# Patient Record
Sex: Female | Born: 1955 | Race: White | Hispanic: No | Marital: Married | Smoking: Never smoker
Health system: Southern US, Community
[De-identification: ages and names within clinical notes are randomized; demographics above are authoritative.]

## PROBLEM LIST (undated history)

## (undated) HISTORY — PX: CARPAL TUNNEL RELEASE: SHX101

---

## 2004-09-16 ENCOUNTER — Ambulatory Visit: Payer: Self-pay | Admitting: Professional

## 2004-10-03 ENCOUNTER — Ambulatory Visit: Payer: Self-pay | Admitting: Professional

## 2004-10-17 ENCOUNTER — Ambulatory Visit: Payer: Self-pay | Admitting: Professional

## 2004-12-16 ENCOUNTER — Ambulatory Visit: Payer: Self-pay | Admitting: Professional

## 2005-04-22 ENCOUNTER — Ambulatory Visit: Payer: Self-pay | Admitting: Unknown Physician Specialty

## 2005-04-25 ENCOUNTER — Ambulatory Visit: Payer: Self-pay | Admitting: Unknown Physician Specialty

## 2005-10-06 ENCOUNTER — Ambulatory Visit: Payer: Self-pay | Admitting: General Surgery

## 2006-02-09 ENCOUNTER — Ambulatory Visit: Payer: Self-pay | Admitting: Professional

## 2006-04-20 ENCOUNTER — Ambulatory Visit: Payer: Self-pay | Admitting: Unknown Physician Specialty

## 2006-05-06 ENCOUNTER — Ambulatory Visit: Payer: Self-pay | Admitting: Unknown Physician Specialty

## 2006-11-09 ENCOUNTER — Ambulatory Visit: Payer: Self-pay | Admitting: Unknown Physician Specialty

## 2006-11-11 ENCOUNTER — Ambulatory Visit: Payer: Self-pay | Admitting: Unknown Physician Specialty

## 2006-12-01 ENCOUNTER — Ambulatory Visit: Payer: Self-pay | Admitting: General Surgery

## 2007-01-14 ENCOUNTER — Ambulatory Visit: Payer: Self-pay | Admitting: General Surgery

## 2007-01-19 ENCOUNTER — Ambulatory Visit: Payer: Self-pay | Admitting: General Surgery

## 2007-04-04 IMAGING — US ULTRASOUND RIGHT BREAST
1 series · 14 of 14 positions shown · non-contrast
Comparison: none

REASON FOR EXAM: Right breast density   NO CHARGE   US if needed
COMMENTS:

PROCEDURE:     US  - US BREAST RIGHT  - May 06, 2006  [DATE]
RESULT:     No cystic or solid lesions are noted.  Reference is made to
additional view mammography report for full discussion of mammographic
findings and further recommendations.

[Series 1: ultrasound right breast · 14 of 14 slices shown]
[im 1/14]
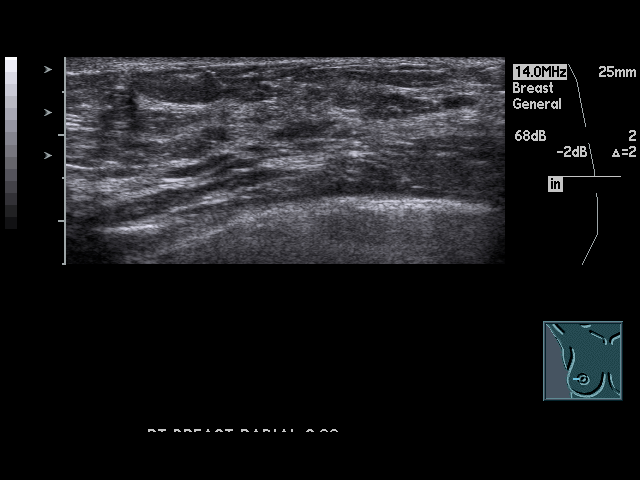
[im 2/14]
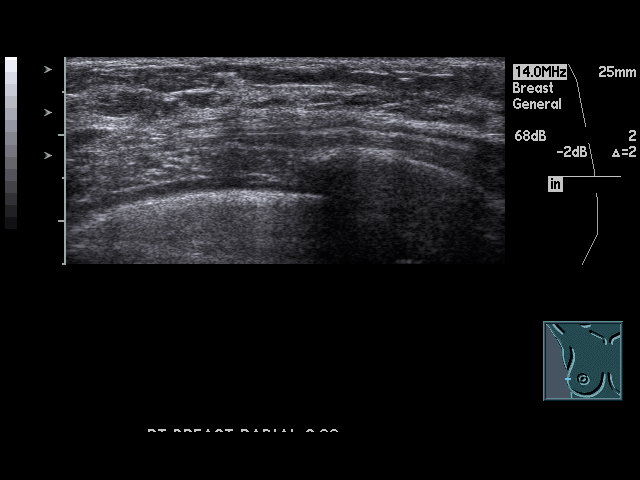
[im 3/14]
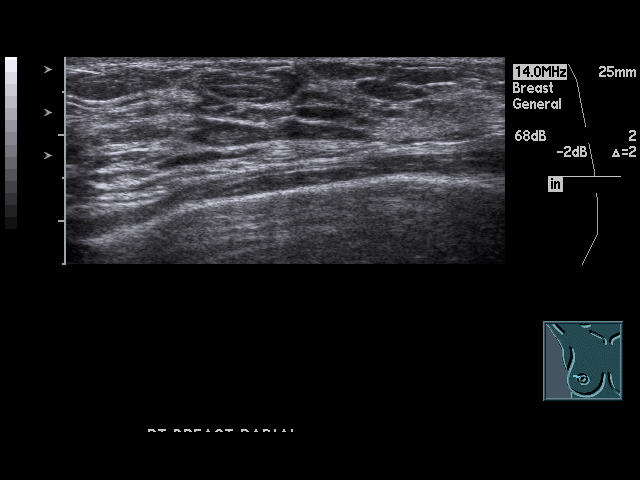
[im 4/14]
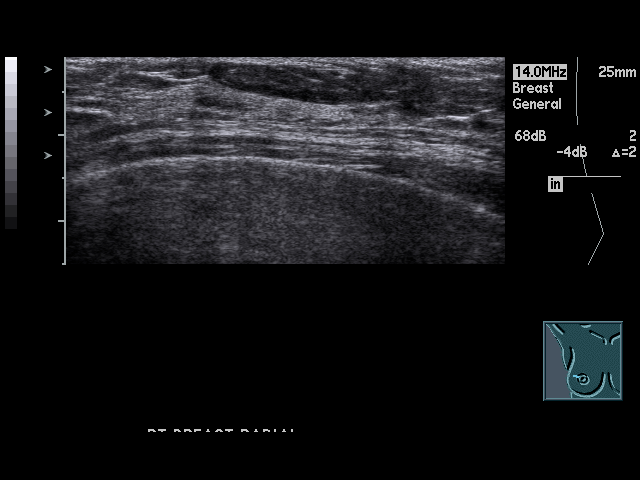
[im 5/14]
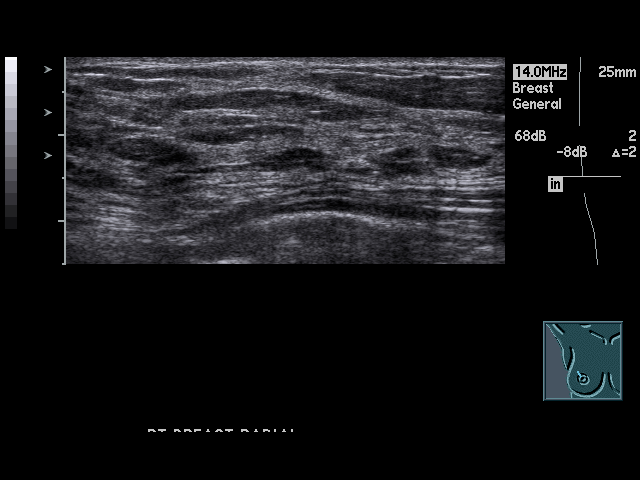
[im 6/14]
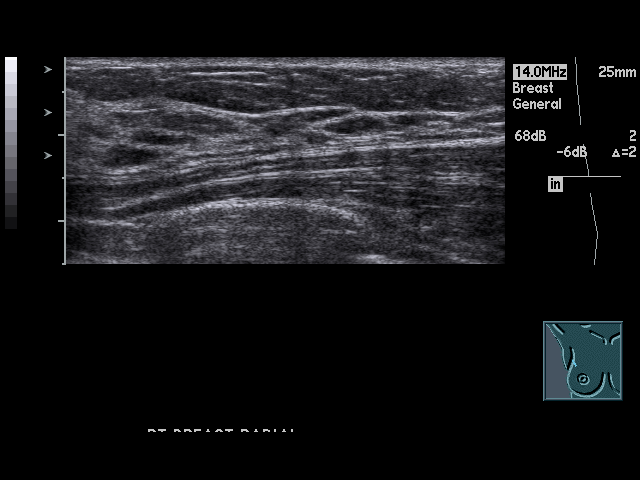
[im 7/14]
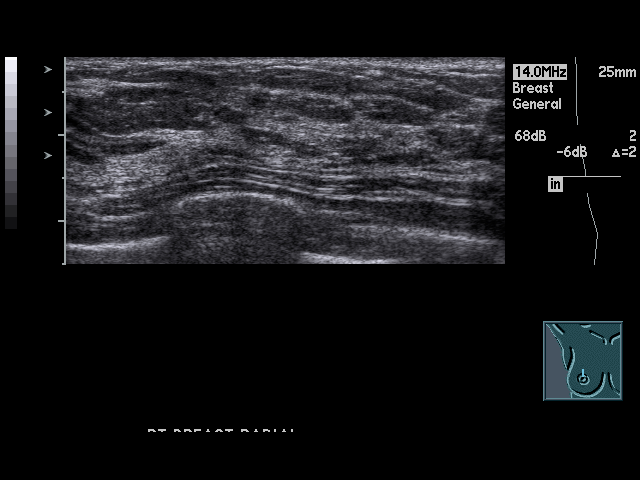
[im 8/14]
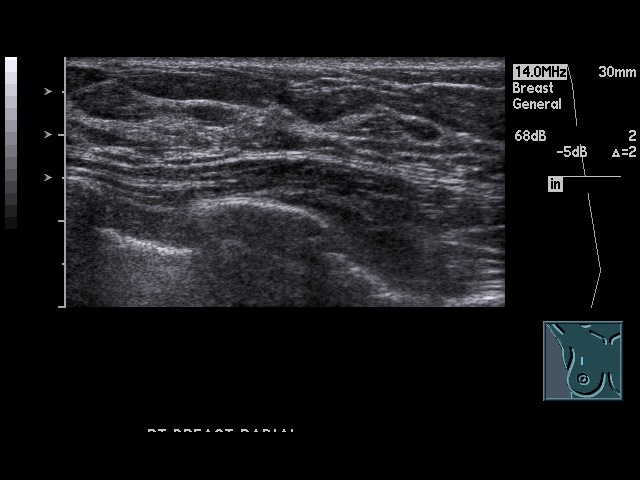
[im 9/14]
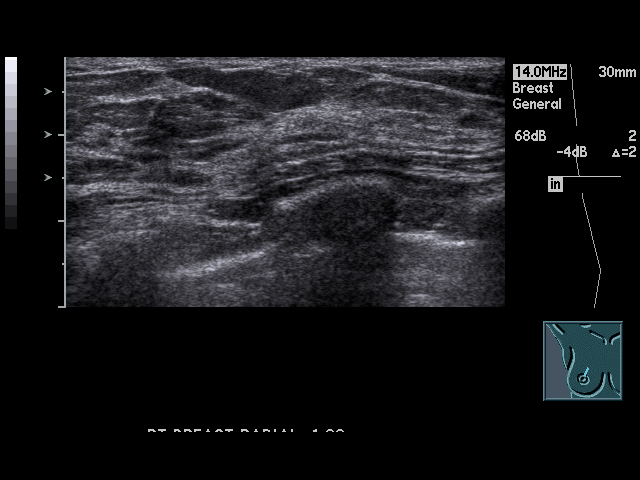
[im 10/14]
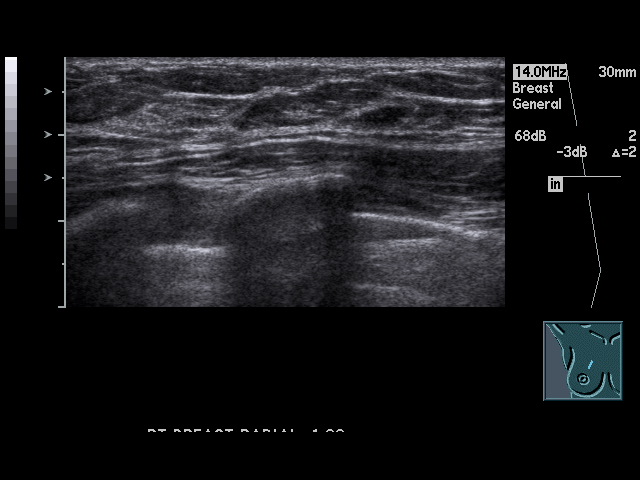
[im 11/14]
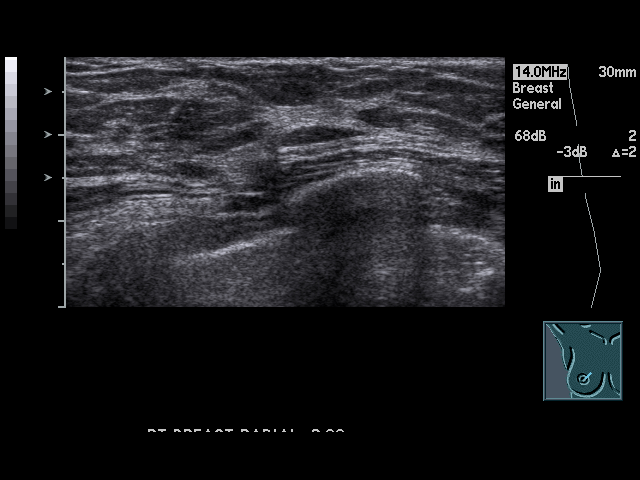
[im 12/14]
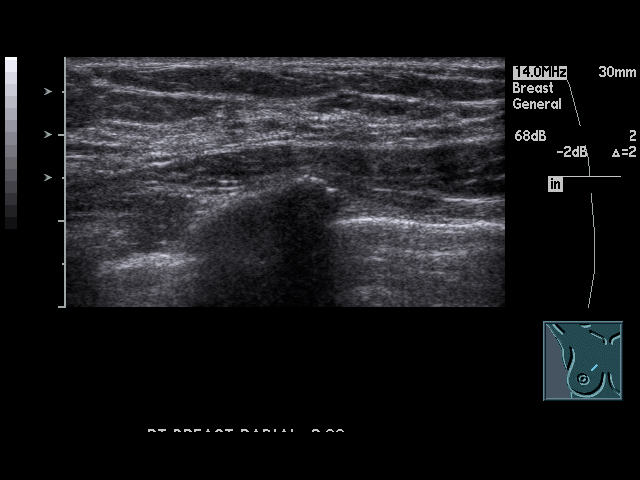
[im 13/14]
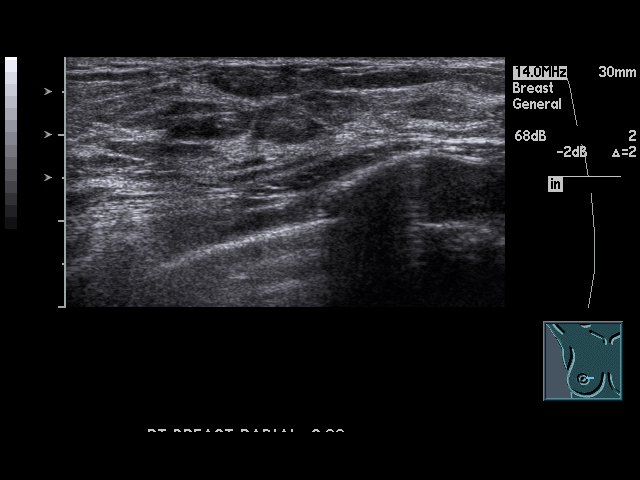
[im 14/14]
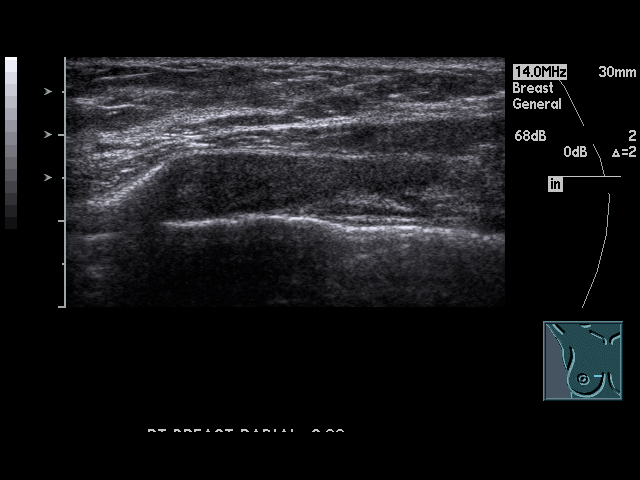

[14 of 14 positions shown; findings below may reference images not displayed]

IMPRESSION: Negative RIGHT breast ultrasound.

Negative mammogram and ultrasound does not preclude biopsy if a palpable
lesion is present.

## 2007-12-08 ENCOUNTER — Ambulatory Visit: Payer: Self-pay | Admitting: Unknown Physician Specialty

## 2008-07-13 ENCOUNTER — Ambulatory Visit: Payer: Self-pay | Admitting: Professional

## 2008-07-20 ENCOUNTER — Ambulatory Visit: Payer: Self-pay | Admitting: Professional

## 2008-07-27 ENCOUNTER — Ambulatory Visit: Payer: Self-pay | Admitting: Professional

## 2008-08-10 ENCOUNTER — Ambulatory Visit: Payer: Self-pay | Admitting: Professional

## 2008-08-28 ENCOUNTER — Ambulatory Visit: Payer: Self-pay | Admitting: Professional

## 2008-10-12 ENCOUNTER — Ambulatory Visit: Payer: Self-pay | Admitting: Professional

## 2009-03-06 ENCOUNTER — Ambulatory Visit: Payer: Self-pay | Admitting: Unknown Physician Specialty

## 2010-06-26 ENCOUNTER — Ambulatory Visit: Payer: Self-pay | Admitting: Unknown Physician Specialty

## 2011-11-04 ENCOUNTER — Ambulatory Visit: Payer: Self-pay | Admitting: Unknown Physician Specialty

## 2014-05-29 ENCOUNTER — Emergency Department (HOSPITAL_BASED_OUTPATIENT_CLINIC_OR_DEPARTMENT_OTHER)
Admission: EM | Admit: 2014-05-29 | Discharge: 2014-05-29 | Disposition: A | Discharge status: Home / Self Care | Payer: 59 | Attending: Emergency Medicine | Admitting: Emergency Medicine

## 2014-05-29 ENCOUNTER — Encounter (HOSPITAL_BASED_OUTPATIENT_CLINIC_OR_DEPARTMENT_OTHER): Payer: Self-pay | Admitting: Emergency Medicine

## 2014-05-29 DIAGNOSIS — M79609 Pain in unspecified limb: Secondary | ICD-10-CM | POA: Diagnosis present

## 2014-05-29 DIAGNOSIS — G5790 Unspecified mononeuropathy of unspecified lower limb: Secondary | ICD-10-CM | POA: Insufficient documentation

## 2014-05-29 DIAGNOSIS — G5731 Lesion of lateral popliteal nerve, right lower limb: Secondary | ICD-10-CM

## 2014-05-29 LAB — D-DIMER, QUANTITATIVE: D-Dimer, Quant: <0.27 ug/mL-FEU (ref 0.00–0.48)

## 2014-05-29 NOTE — Discharge Instructions (Signed)
Common Peroneal Nerve Entrapment with Rehab The peroneal nerve and its branches are responsible for muscle control of the muscles that extend to the toes, foot, ankle. This nerve is also responsible for sensation on the outer side of the lower leg and foot. Injury to the peroneal nerve results in problems with sensation and muscle control in these areas. Injury to the peroneal nerve often occurs in the area where the nerve passes around the top of one of the lower leg bones (fibular head). The nerve becomes trapped, causing pain, tingling, numbness, or burning sensations. SYMPTOMS   Pain, tingling, numbness, or burning on the top of the foot, ankle, or outer part of the lower leg.  Pain that gets worse with physical activity (walking, running, squatting).  Weakness when lifting the foot, including moving the ankle and toes upward (foot drop), or turning the foot outward with walking.  Problems walking (having to lift the foot high) or running, including tripping over the foot.  Inflammation, bruising (contusion), and tenderness near the outer part of the knee (or just below the knee). CAUSES  Common peroneal nerve entrapment is caused by pressure being placed on the peroneal nerve. This pressure may occur due to direct contact (being tackled at the knees), inflammation, a cyst in the knee, or a healing fracture around the knee. Less commonly, peroneal nerve entrapment may be caused by a stretch injury (knee sprain) or with swelling in the leg (compartment syndrome). RISK INCREASES WITH:  Recurring foot, ankle, or knee sprains.  Playing sports on uneven ground, which may result in knee or ankle sprains.  Direct injury (trauma) to the knee. PREVENTION  Warm up and stretch properly before activity.  Maintain physical fitness:  Strength, flexibility, and endurance.  Cardiovascular fitness.  Wear properly fitted and padded protective equipment. PROGNOSIS  Common peroneal nerve  entrapment is often curable with non-surgical treatment. Often symptoms will go away on their own (spontaneously). Sometimes, surgery is needed to relieve pressure from the nerve.  RELATED COMPLICATIONS   Permanent pain, tingling, numbness, or weakness of the affected foot, ankle, and leg.  Inability to compete, due to pain or weakness.  Injury to other parts of the body, as a result of repeated tripping and falling over the foot. TREATMENT Treatment first involves resting from any activities that cause the symptoms to get worse. The use of ice and medicine may reduce pain and inflammation. If ice is used, do not place it directly on the skin. Instead, place a towel in-between. If there is weakness of the muscles, causing foot drop, bracing the ankle and foot may be needed. It is important to perform strengthening and stretching exercises to maintain muscle strength. These exercises may be completed at home or with a therapist. If pain continues to get worse, despite treatment, or a cyst is present, surgery may be needed to relieve the pressure on the nerve. If the pressure on the nerve is due to compartment syndrome, a fascial (sheet of connective tissue) release may need to be performed. The earlier surgery is performed, the better your chances of full recovery.  MEDICATION   If pain medicine is needed, nonsteroidal anti-inflammatory medicines (aspirin and ibuprofen), or other minor pain relievers (acetaminophen), are often advised.  Do not take pain medicine for 7 days before surgery.  Prescription pain relievers may be given if your caregiver thinks they are needed. Use only as directed and only as much as you need. COLD THERAPY   Cold treatment (  icing) should be applied for 10 to 15 minutes every 2 to 3 hours for inflammation and pain, and immediately after activity that aggravates your symptoms. Use ice packs or an ice massage. SEEK MEDICAL CARE IF:   Symptoms get worse.  Symptoms do  not improve in 2 weeks, despite treatment.  New, unexplained symptoms develop. (Drugs used in treatment may produce side effects.) EXERCISES RANGE OF MOTION (ROM) AND STRETCHING EXERCISES - Common Peroneal Nerve Entrapment These exercises may help you when beginning to rehabilitate your injury. Your symptoms may go away with or without further involvement from your physician, physical therapist or athletic trainer. While completing these exercises, remember:   Restoring tissue flexibility helps normal motion to return to the joints. This allows healthier, less painful movement and activity.  An effective stretch should be held for at least 30 seconds.  A stretch should never be painful. You should only feel a gentle lengthening or release in the stretched tissue. RANGE OF MOTION - Ankle Eversion   Sit with your right / left ankle crossed over your opposite knee.  Grip your foot with your opposite hand, placing your thumb on the top of your foot and your fingers across the bottom of your foot.  Gently push your foot downward with a slight rotation, so your littlest toes rise slightly toward the ceiling.  You should feel a gentle stretch on the inside of your ankle. Hold the stretch for __________ seconds. Repeat __________ times. Complete this exercise __________ times per day.  RANGE OF MOTION - Ankle Inversion   Sit with your right / left ankle crossed over your opposite knee.  Grip your foot with your opposite hand, placing your thumb on the bottom of your foot and your fingers across the top of your foot.  Gently pull your foot so the smallest toe comes toward you and your thumb pushes the inside of the ball of your foot away from you.  You should feel a gentle stretch on the outside of your ankle. Hold the stretch for __________ seconds. Repeat __________ times. Complete this exercise __________ times per day.  RANGE OF MOTION - Ankle Dorsiflexion, Active Assisted   Remove your  shoes and sit on a chair, preferably not on a carpeted surface.  Place your right / left foot directly under your knee. Extend your opposite leg for support.  Keeping your heel down, slide your right / left foot back toward the chair, until you feel a stretch at your ankle or calf. If you do not feel a stretch, slide your bottom forward to the edge of the chair, while still keeping your heel down.  Hold this stretch for __________ seconds. Repeat __________ times. Complete this stretch __________ times per day.  STRETCH - Gastroc, Standing   Place your hands on a wall.  Extend your right / left leg behind you, and place a folded washcloth under the arch of your foot for support. Keep the front knee somewhat bent.  Slightly point your toes inward on your back foot.  Keeping your right / left heel on the floor and your knee straight, shift your weight toward the wall, not allowing your back to arch.  You should feel a gentle stretch in the right / left calf. Hold this position for __________ seconds. Repeat __________ times. Complete this stretch __________ times per day. STRETCH - Soleus, Standing   Place your hands on a wall.  Extend your right / left leg behind you, and place  a folded washcloth under the arch of your foot for support. Keep the front knee somewhat bent.  Slightly point your toes inward on your back foot.  Keep your right / left heel on the floor, bend your back knee, and slightly shift your weight over the back leg, so that you feel a gentle stretch deep in your back calf.  Hold this position for __________ seconds. Repeat __________ times. Complete this stretch __________ times per day. STRETCH - Hamstrings, Standing  Stand or sit and extend your right / left leg, placing your foot on a chair or foot stool, keeping a slight arch in your low back and your hips straight forward.  Lead with your chest and lean forward at the waist, until you feel a gentle stretch in  the back of your right / left knee or thigh. (When done correctly, this exercise requires leaning only a small distance.)  Hold this position for __________ seconds. Repeat __________ times. Complete this stretch __________ times per day. STRENGTHENING EXERCISES - Common Peroneal Nerve Entrapment These exercises may help you when beginning to rehabilitate your injury. They may resolve your symptoms with or without further involvement from your physician, physical therapist or athletic trainer. While completing these exercises, remember:   Muscles can gain both the endurance and the strength needed for everyday activities through controlled exercises.  Complete these exercises as instructed by your physician, physical therapist or athletic trainer. Increase the resistance and repetitions only as guided.  You may experience muscle soreness or fatigue, but the pain or discomfort you are trying to eliminate should never worsen during these exercises. If this pain does get worse, stop and make sure you are following the directions exactly. If the pain is still present after adjustments, discontinue the exercise until you can discuss the trouble with your caregiver. STRENGTH - Dorsiflexors  Secure a rubber exercise band or tubing to a fixed object (table, pole) and loop the other end around your right / left foot.  Sit on the floor facing the fixed object. The band should be slightly tense when your foot is relaxed.  Slowly draw your foot back toward you, using your ankle and toes.  Hold this position for __________ seconds. Slowly release the tension in the band and return your foot to the starting position. Repeat __________ times. Complete this exercise __________ times per day.  STRENGTH - Ankle Eversion   Secure one end of a rubber exercise band or tubing to a fixed object (table, pole). Loop the other end around your foot, just before your toes.  Place your fists between your knees. This  will focus your strengthening at your ankle.  Drawing the band across your opposite foot, away from the pole, slowly, pull your little toe out and up. Make sure the band is positioned to resist the entire motion.  Hold this position for __________ seconds.  Return to the starting position slowly, controlling the tension in the band. Repeat __________ times. Complete this exercise __________ times per day.  STRENGTH - Ankle Inversion   Secure one end of a rubber exercise band or tubing to a fixed object (table, pole). Loop the other end around your foot, just before your toes.  Place your fists between your knees. This will focus your strengthening at your ankle.  Slowly, pull your big toe up and in, making sure the band is positioned to resist the entire motion.  Hold this position for __________ seconds.  Return to the starting position  slowly, controlling the tension in the band. Repeat __________ times. Complete this exercises __________ times per day.  Document Released: 09/15/2005 Document Revised: 01/10/2013 Document Reviewed: 12/28/2008 [facility] Patient Information 2015 [place], [place]. This information is not intended to replace advice given to you by your health care provider. Make sure you discuss any questions you have with your health care provider.  Electroneurography Test This test measures the conduction of the nerves throughout the body. It is very similar to the way electricity travels throughout your house. When a stimulus is activated at one site, this test determines how long it takes to travel to another location such as making a muscle contract. It would be similar to turning on a light switch and measuring the time it takes for the light bulb to come on. This test is called electroneurography and studies the nerves. PREPARATION FOR TEST No preparation or fasting is necessary. NORMAL FINDINGS No evidence of peripheral nerve injury or disease (conduction velocity is  usually lower in the elderly). Ranges for normal findings may vary among different laboratories and hospitals. You should always check with your doctor after having lab work or other tests done to discuss the meaning of your test results and whether your values are considered within normal limits. MEANING OF TEST  Your caregiver will go over the test results with you and discuss the importance and meaning of your results, as well as treatment options and the need for additional tests if necessary. OBTAINING THE TEST RESULTS It is your responsibility to obtain your test results. Ask the lab or department performing the test when and how you will get your results. Document Released: 01/16/2005 Document Revised: 01/30/2014 Document Reviewed: 08/25/2008 [facility] Patient Information 2015 [place], [place]. This information is not intended to replace advice given to you by your health care provider. Make sure you discuss any questions you have with your health care provider.

## 2014-05-29 NOTE — ED Notes (Signed)
Pain in her right lower leg, hip and foot x 3 weeks. No sob.

## 2014-05-29 NOTE — ED Provider Notes (Signed)
CSN: [id number]     Arrival date & time 05/29/14  1508 History  This chart was scribed for Nelia Shi, MD, by Yevette Edwards, ED Scribe. This patient was seen in room MH03/MH03 and the patient's care was started at 3:55 PM.   First MD Initiated Contact with Patient 05/29/14 1555     Chief Complaint  Patient presents with  . Leg Pain    The history is provided by the patient. No language interpreter was used.   HPI Comments: Margaret Everett is a 58 y.o. female, with a h/o hormone replacement therapy, who presents to the Emergency Department complaining of right lateral lower leg pain which she characterizes as burning and which has persisted for three weeks. She endorses intermittent distributions of the pain to her right foot as well as her right buttock. She denies leg swelling, chest pain, and SOB. The pain is improved when she is recumbent. PTA, Margaret Everett called Dr. Charlott Rakes office which referred her to the ED for an ultrasound; she has not been assessed by a physician for the current symptoms. Margaret Everett reports she sits at work for the predominant part of the day and she often sits with her legs crossed, with the right leg crossed over the left. She states she ceased using hormone replacement therapy four days ago. Margaret Everett is a non-smoker.   History reviewed. No pertinent past medical history. Past Surgical History  Procedure Laterality Date  . Carpal tunnel release     No family history on file. History  Substance Use Topics  . Smoking status: Never Smoker   . Smokeless tobacco: Not on file  . Alcohol Use: No   No OB history provided.  Review of Systems  Respiratory: Negative for shortness of breath.   Cardiovascular: Negative for chest pain and leg swelling.  Musculoskeletal: Positive for myalgias.  All other systems reviewed and are negative.   Allergies  Review of patient's allergies indicates no known allergies.  Home Medications   Prior to Admission medications    Medication Sig Start Date End Date Taking? Authorizing Provider  estradiol (VIVELLE-DOT) 0.0375 MG/24HR Place 1 patch onto the skin 2 (two) times a week.   Yes Historical Provider, MD  PROGESTERONE MICRONIZED TD Place onto the skin.   Yes Historical Provider, MD   Triage Vitals: BP 157/77  Pulse 94  Temp(Src) 98.5 F (36.9 C) (Oral)  Resp 18  Ht 5' 2.5" (1.588 m)  Wt 135 lb (61.236 kg)  BMI 24.28 kg/m2  SpO2 100%  Physical Exam Physical Exam  Nursing note and vitals reviewed. Constitutional: She is oriented to person, place, and time. She appears well-developed and well-nourished. No distress.  HENT:  Head: Normocephalic and atraumatic.  Eyes: Pupils are equal, round, and reactive to light.  Neck: Normal range of motion.  Cardiovascular: Normal rate and intact distal pulses.   Pulmonary/Chest: No respiratory distress.  Abdominal: Normal appearance. She exhibits no distension.  Musculoskeletal: Normal range of motion.  Homans sign is negative.  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  No swelling or calf tenderness noted.  No thigh tenderness or swelling noted. Neurological: She is alert and oriented to person, place, and time. No cranial nerve deficit.  Skin: Skin is warm and dry. No rash noted.  Psychiatric: She has a normal mood and affect. Her behavior is normal.   ED Course  Procedures (including critical care time)  DIAGNOSTIC STUDIES: Oxygen Saturation is 100% on room air,  normal by my interpretation.    COORDINATION OF CARE:  4:03 PM- Discussed treatment plan with patient, and the patient agreed to the plan. The plan includes a D-dimer.   Labs Review Labs Reviewed  D-DIMER, QUANTITATIVE    Imaging Review No results found.  I suspect from the physical exam and negative d-dimer that is a peroneal nerve neuropathy and not a deep venous thrombosis.  Patient is stable at time of discharge and can followup with her family  decision for nerve conduction velocity or further workup as needed.  MDM   Final diagnoses:  Peroneal neuropathy at knee, right     I personally performed the services described in this documentation, which was scribed in my presence. The recorded information has been reviewed and considered.    Nelia Shi, MD 05/29/14 [phone]

## 2014-05-29 NOTE — ED Notes (Signed)
Right calf pain, sent by Dr. Tenny Craw for Venous doppler study.  Pt states pain started three weeks ago.  No chest pain or SOB.

## 2020-02-25 ENCOUNTER — Ambulatory Visit: Payer: Self-pay | Attending: Internal Medicine

## 2020-02-25 DIAGNOSIS — Z23 Encounter for immunization: Secondary | ICD-10-CM

## 2020-02-25 NOTE — Progress Notes (Signed)
   Covid-19 Vaccination Clinic  Name:  Margaret Everett    MRN: [id number] DOB: 07/14/1956  02/25/2020  [name] was observed post Covid-19 immunization for 15 minutes without incident. She was provided with Vaccine Information Sheet and instruction to access the V-Safe system.   [name] was instructed to call 911 with any severe reactions post vaccine: Marland Kitchen Difficulty breathing  . Swelling of face and throat  . A fast heartbeat  . A bad rash all over body  . Dizziness and weakness   Immunizations Administered    Name Date Dose VIS Date Route   Pfizer COVID-19 Vaccine 02/25/2020  8:50 AM 0.3 mL 11/23/2018 Intramuscular   Manufacturer: [employer], [employer]   Lot: [id number]   NDC: [address]-3

## 2020-03-19 ENCOUNTER — Ambulatory Visit: Payer: Self-pay | Attending: Internal Medicine

## 2020-03-19 DIAGNOSIS — Z23 Encounter for immunization: Secondary | ICD-10-CM

## 2020-03-19 NOTE — Progress Notes (Signed)
   Covid-19 Vaccination Clinic  Name:  Margaret Everett    MRN: [id number] DOB: 09/14/1956  03/19/2020  [name] was observed post Covid-19 immunization for 15 minutes without incident. She was provided with Vaccine Information Sheet and instruction to access the V-Safe system.   [name] was instructed to call 911 with any severe reactions post vaccine: Marland Kitchen Difficulty breathing  . Swelling of face and throat  . A fast heartbeat  . A bad rash all over body  . Dizziness and weakness   Immunizations Administered    Name Date Dose VIS Date Route   Pfizer COVID-19 Vaccine 03/19/2020  3:53 PM 0.3 mL 11/23/2018 Intramuscular   Manufacturer: [employer], [employer]   Lot: [id number]   NDC: [address]-7

## 2020-05-30 ENCOUNTER — Other Ambulatory Visit: Payer: Self-pay | Admitting: Obstetrics and Gynecology

## 2020-05-30 DIAGNOSIS — R928 Other abnormal and inconclusive findings on diagnostic imaging of breast: Secondary | ICD-10-CM

## 2020-06-13 ENCOUNTER — Ambulatory Visit
Admission: RE | Admit: 2020-06-13 | Discharge: 2020-06-13 | Disposition: A | Discharge status: Home / Self Care | Payer: 59 | Source: Ambulatory Visit | Attending: Obstetrics and Gynecology | Admitting: Obstetrics and Gynecology

## 2020-06-13 ENCOUNTER — Other Ambulatory Visit: Payer: Self-pay | Admitting: Obstetrics and Gynecology

## 2020-06-13 ENCOUNTER — Other Ambulatory Visit: Payer: Self-pay

## 2020-06-13 DIAGNOSIS — R928 Other abnormal and inconclusive findings on diagnostic imaging of breast: Secondary | ICD-10-CM

## 2020-12-12 ENCOUNTER — Other Ambulatory Visit: Payer: Self-pay | Admitting: Obstetrics and Gynecology

## 2020-12-12 ENCOUNTER — Ambulatory Visit
Admission: RE | Admit: 2020-12-12 | Discharge: 2020-12-12 | Disposition: A | Discharge status: Home / Self Care | Payer: 59 | Source: Ambulatory Visit | Attending: Obstetrics and Gynecology | Admitting: Obstetrics and Gynecology

## 2020-12-12 ENCOUNTER — Other Ambulatory Visit: Payer: Self-pay

## 2020-12-12 DIAGNOSIS — R928 Other abnormal and inconclusive findings on diagnostic imaging of breast: Secondary | ICD-10-CM

## 2020-12-12 DIAGNOSIS — N63 Unspecified lump in unspecified breast: Secondary | ICD-10-CM

## 2021-06-17 ENCOUNTER — Ambulatory Visit
Admission: RE | Admit: 2021-06-17 | Discharge: 2021-06-17 | Disposition: A | Discharge status: Home / Self Care | Payer: BC Managed Care – PPO | Source: Ambulatory Visit | Attending: Obstetrics and Gynecology | Admitting: Obstetrics and Gynecology

## 2021-06-17 ENCOUNTER — Other Ambulatory Visit: Payer: Self-pay

## 2021-06-17 DIAGNOSIS — N63 Unspecified lump in unspecified breast: Secondary | ICD-10-CM

## 2022-05-16 IMAGING — US US BREAST*L* LIMITED INC AXILLA
1 series · 7 of 7 positions shown · non-contrast
Comparison: Previous exam(s).

CLINICAL DATA: 65-year-old female presenting for follow-up of a
likely benign left breast mass.

EXAM:
DIGITAL DIAGNOSTIC BILATERAL MAMMOGRAM WITH TOMOSYNTHESIS AND CAD;
ULTRASOUND LEFT BREAST LIMITED
TECHNIQUE: Bilateral digital diagnostic mammography and breast tomosynthesis
was performed. The images were evaluated with computer-aided
detection.; Targeted ultrasound examination of the left breast was
performed.

[Series 1: us breast*left* limited inc axilla · 0.06mm/px · 7 of 7 slices shown]
[im 1/7]
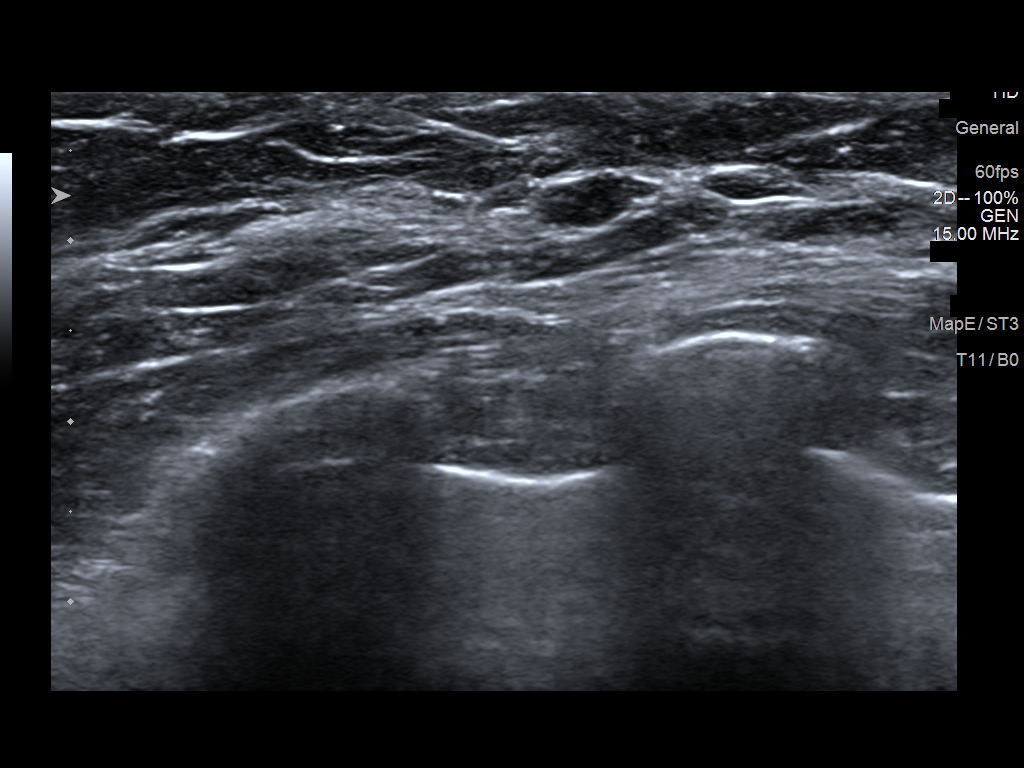
[im 2/7]
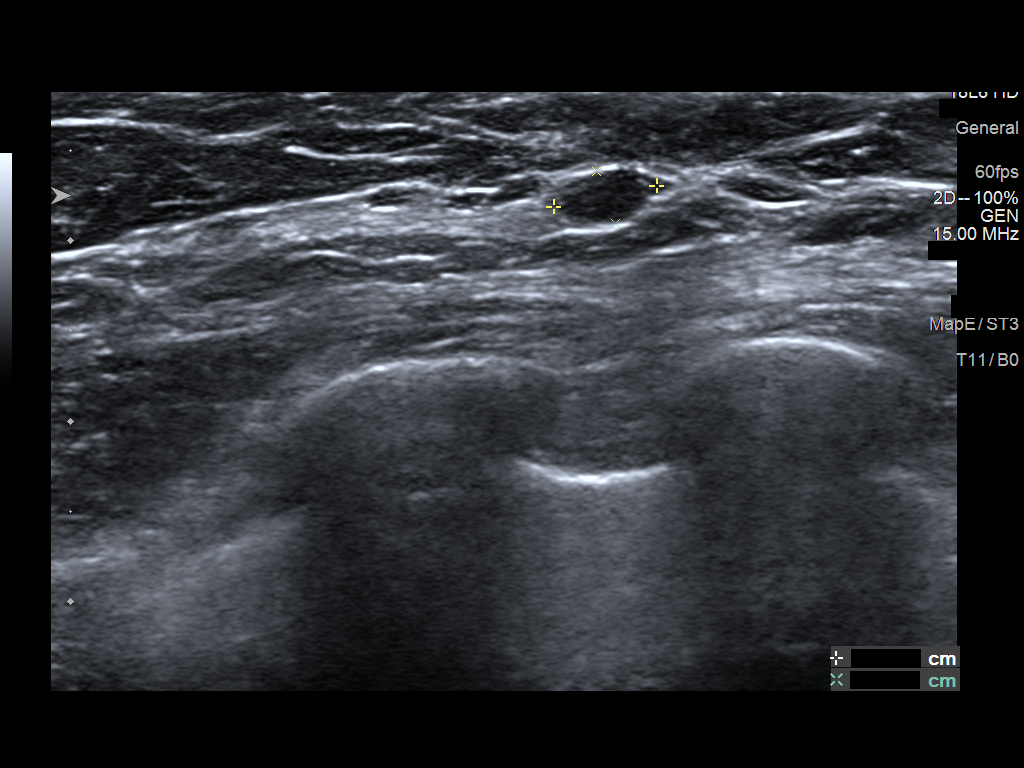
[im 3/7]
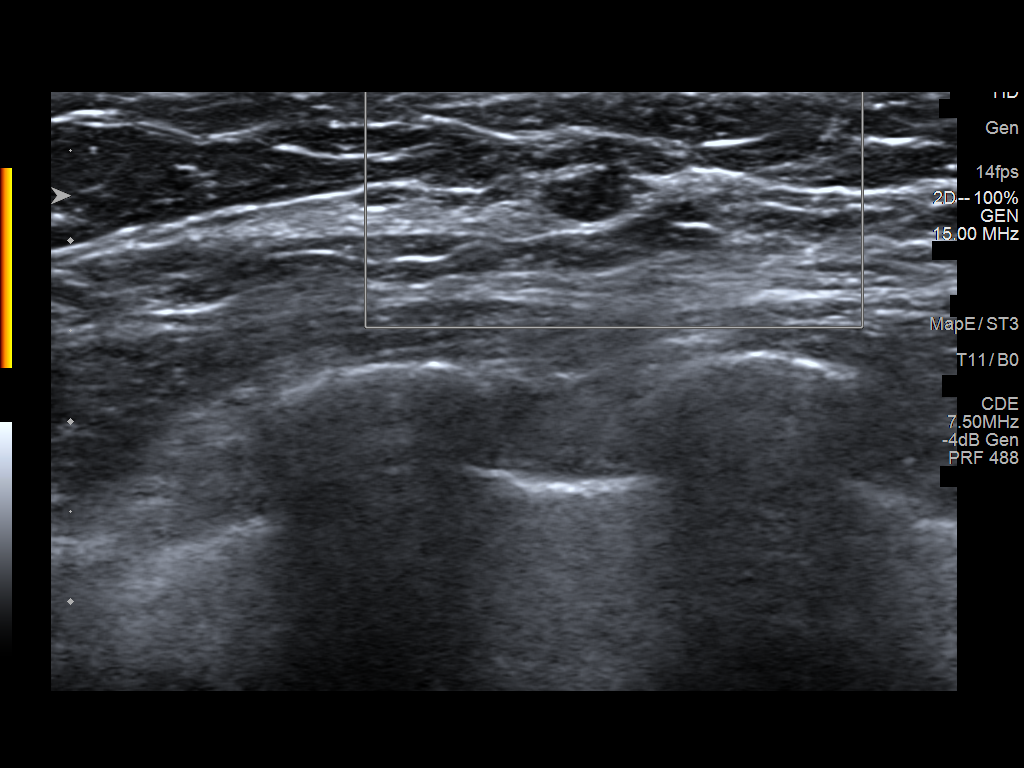
[im 4/7]
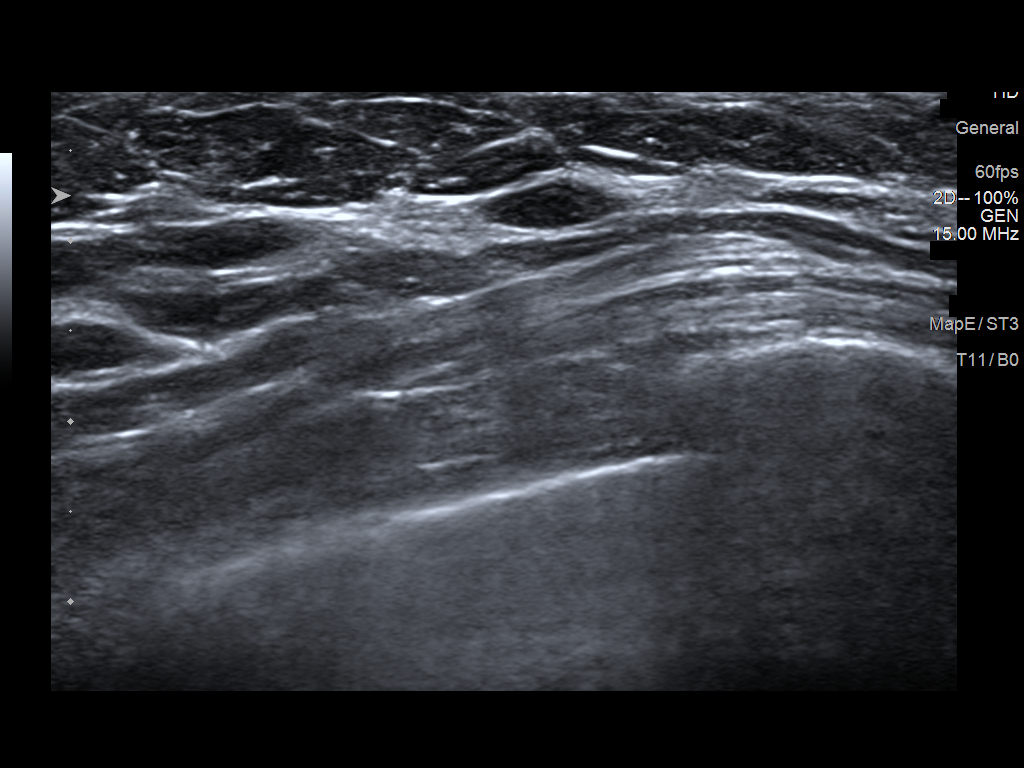
[im 5/7]
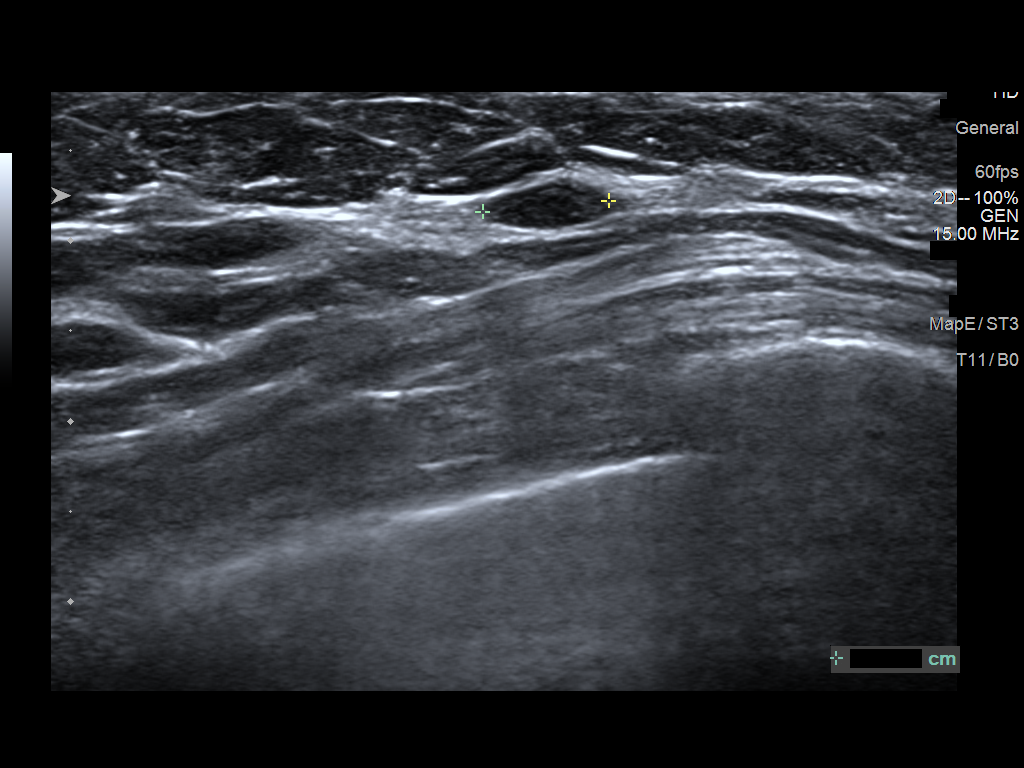
[im 6/7]
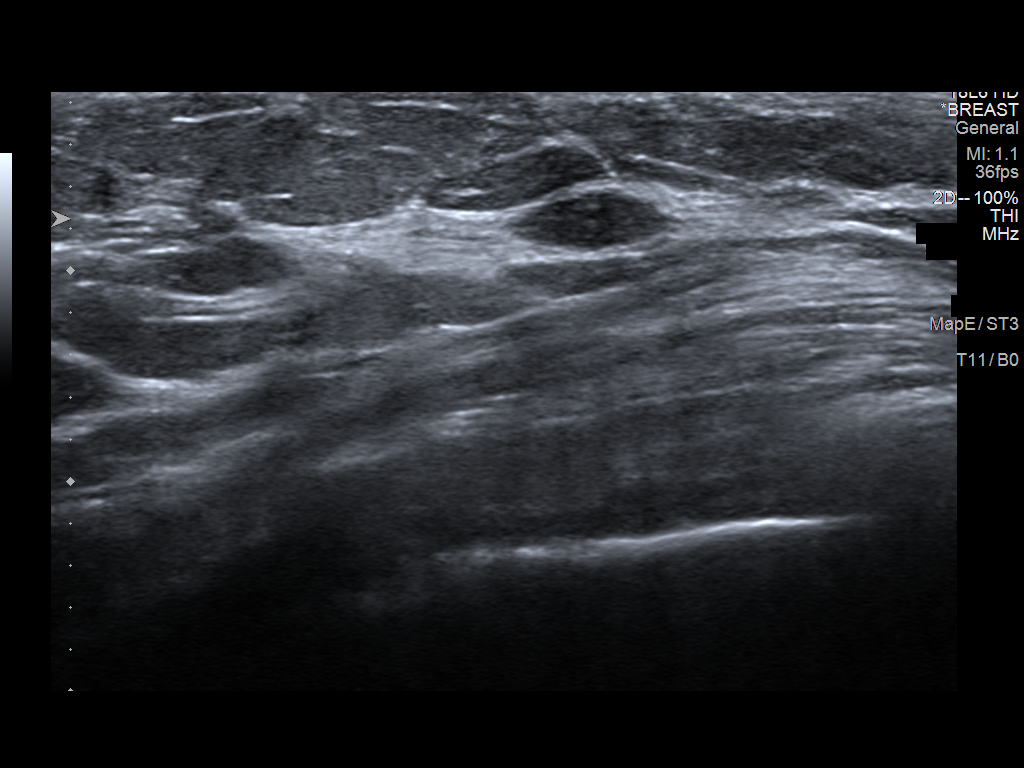
[im 7/7]
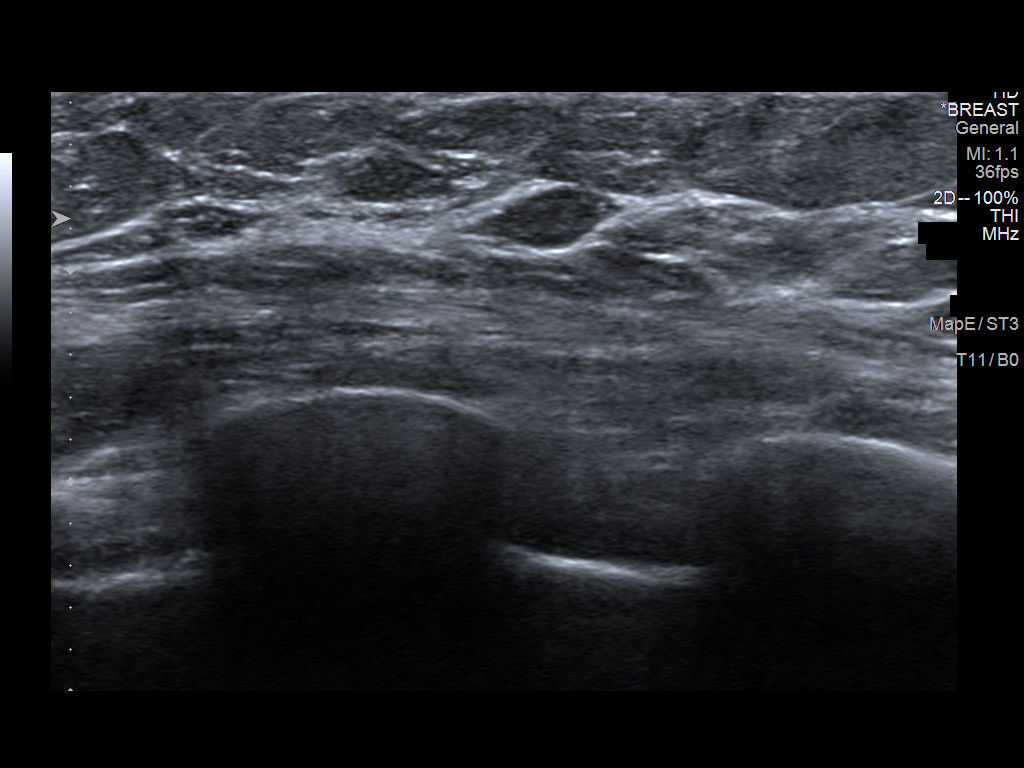

[7 of 7 positions shown; findings below may reference images not displayed]

ACR Breast Density Category b: There are scattered areas of
fibroglandular density.
FINDINGS: No suspicious calcifications, masses or areas of distortion are seen
in the bilateral breasts. There is a persistent mass seen in the
lateral left breast which appears mildly less prominent than on the
comparison exam. No suspicious calcifications, masses or areas of
distortion are seen in the bilateral breasts.

Ultrasound targeted to the left breast at 2 o'clock, 6 cm from the
nipple demonstrates a stable oval hypoechoic mass measuring 7 x 3 x
6 mm, previously measuring 7 x 3 x 7 mm.
IMPRESSION: 1.  The likely benign 7 mm left breast mass at 2 o'clock is stable.

2.  No mammographic evidence of malignancy in the bilateral breasts.

RECOMMENDATION:
Bilateral diagnostic mammogram and left breast ultrasound in 1 year.

I have discussed the findings and recommendations with the patient.
If applicable, a reminder letter will be sent to the patient
regarding the next appointment.

BI-RADS CATEGORY  3: Probably benign.

## 2022-05-16 IMAGING — MG DIGITAL DIAGNOSTIC BILAT W/ TOMO W/ CAD
8 series · 8 of 24 positions shown · non-contrast
Comparison: Previous exam(s).

CLINICAL DATA: 65-year-old female presenting for follow-up of a
likely benign left breast mass.

EXAM:
DIGITAL DIAGNOSTIC BILATERAL MAMMOGRAM WITH TOMOSYNTHESIS AND CAD;
ULTRASOUND LEFT BREAST LIMITED
TECHNIQUE: Bilateral digital diagnostic mammography and breast tomosynthesis
was performed. The images were evaluated with computer-aided
detection.; Targeted ultrasound examination of the left breast was
performed.

[R CC synth-2D]
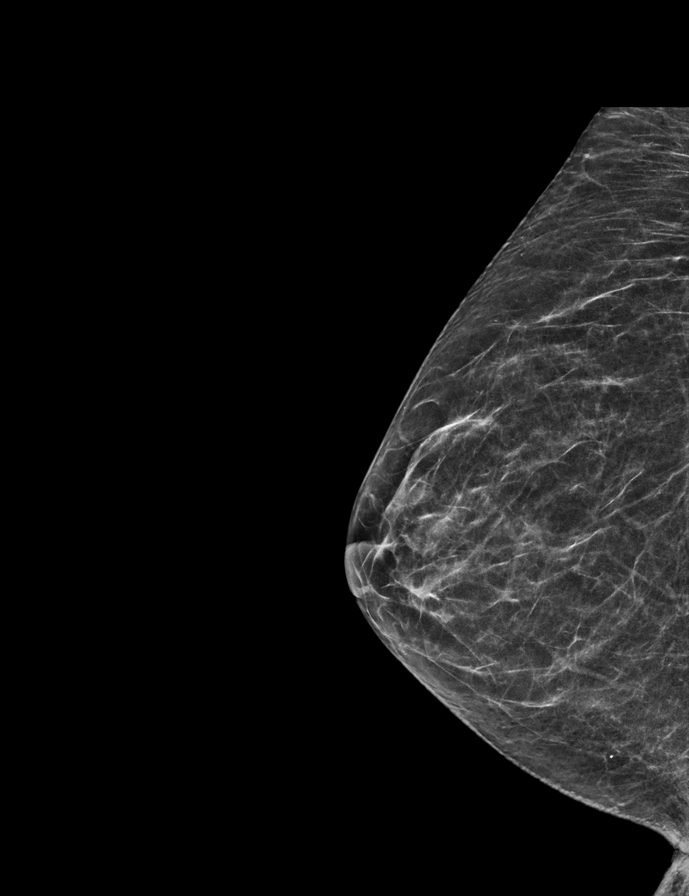

[R MLO synth-2D]
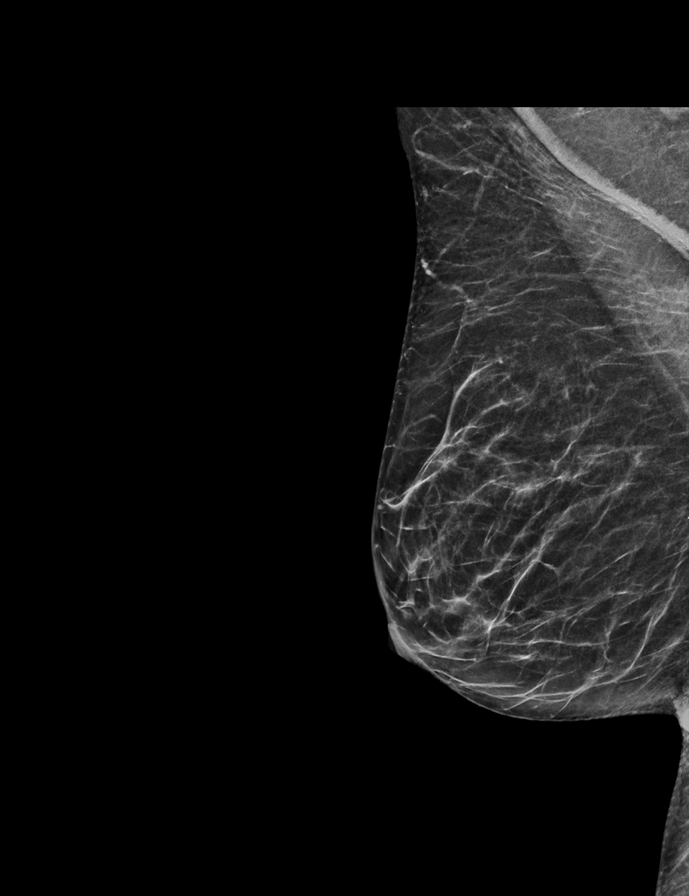

[L MLO synth-2D]
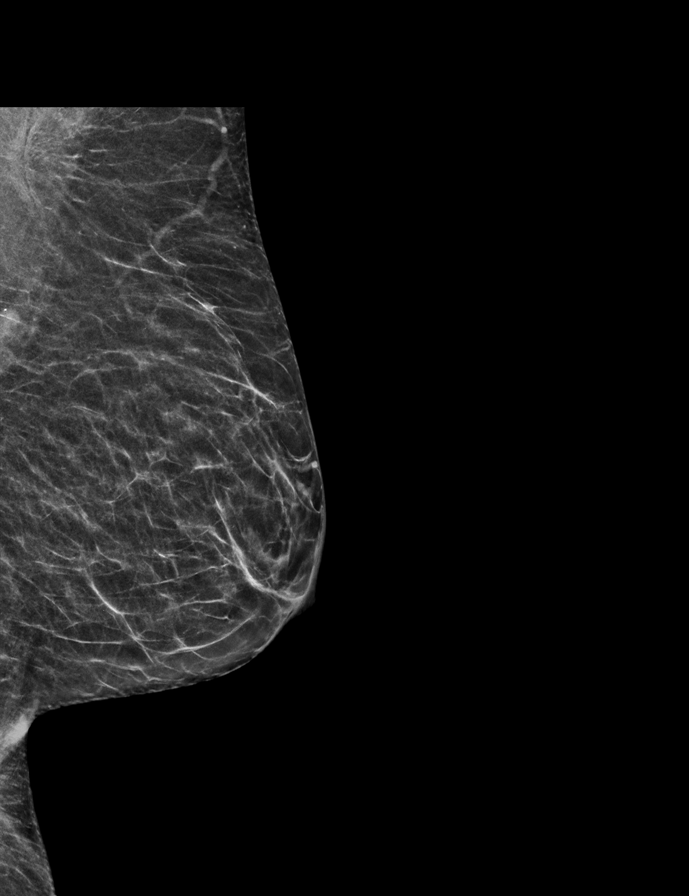

[L CC synth-2D]
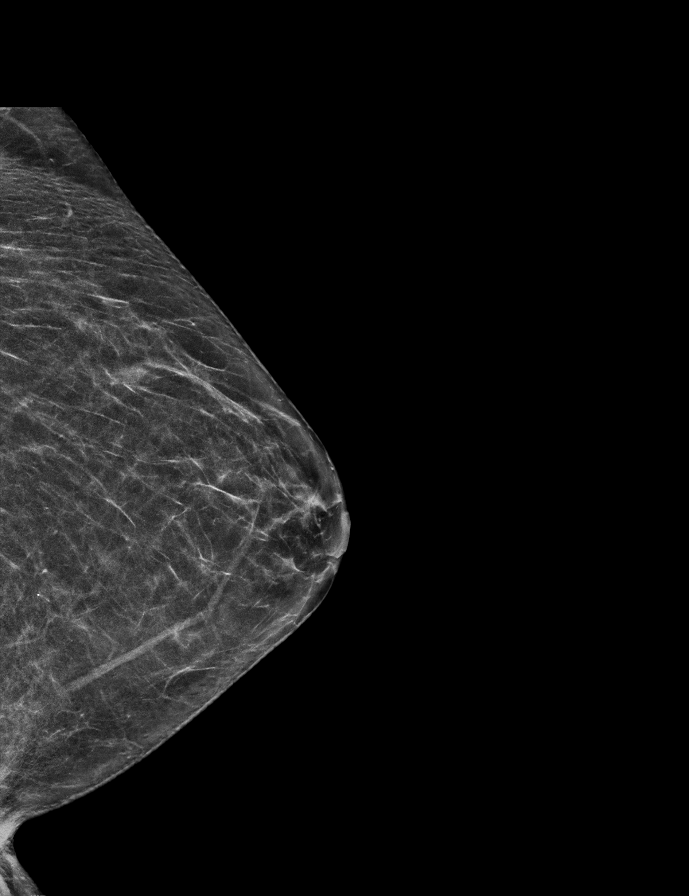

[R MLO tomo · tomo slice 27/52.0]
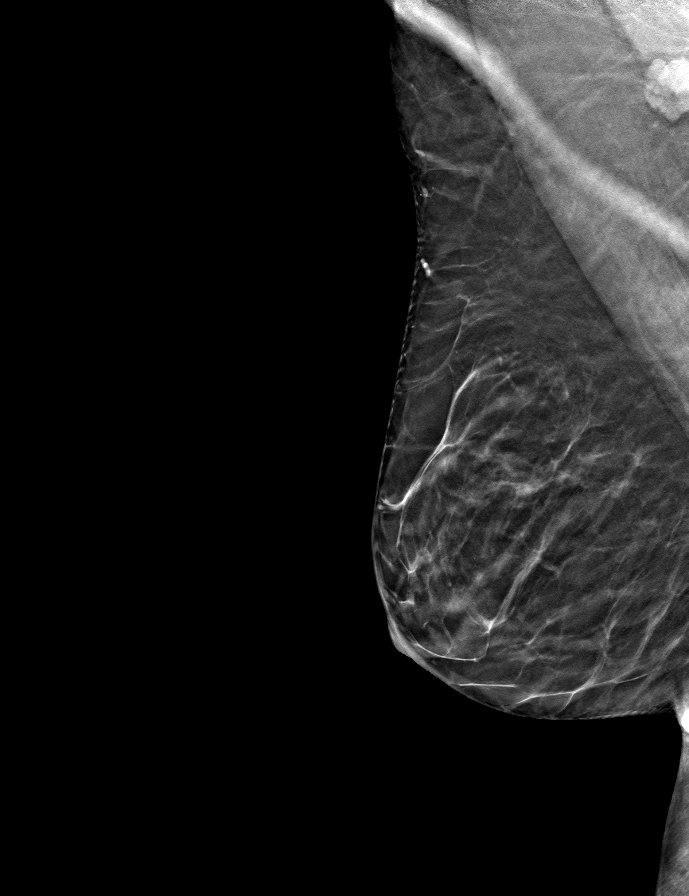

[L MLO tomo · tomo slice 25/49.0]
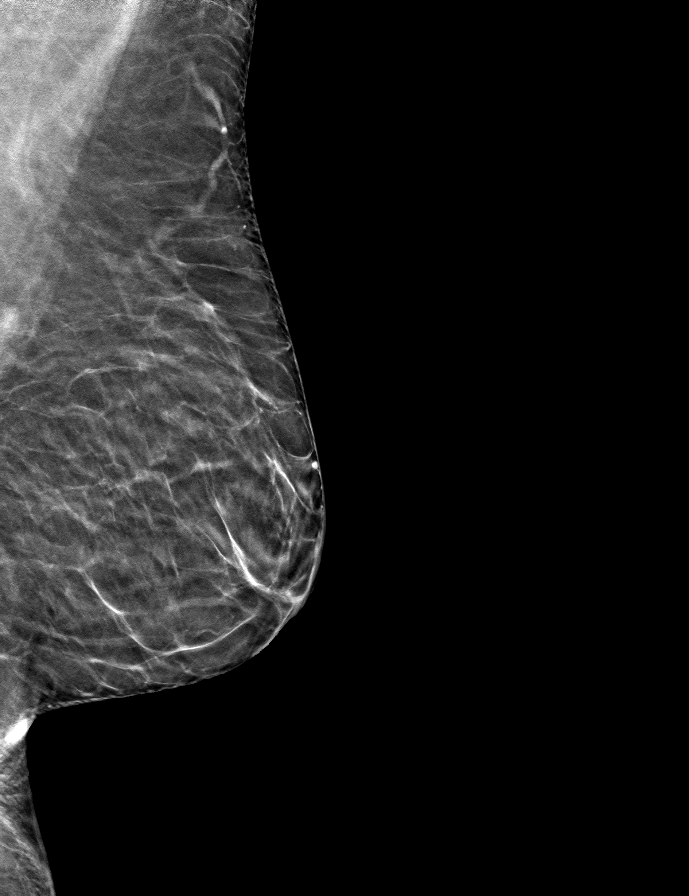

[R CC tomo · tomo slice 25/49.0]
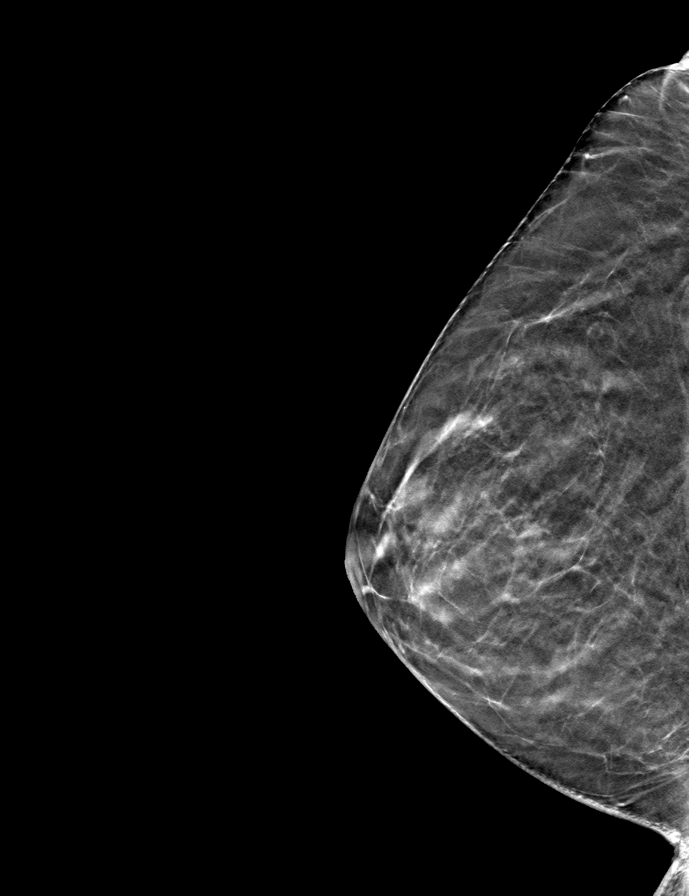

[L CC tomo · tomo slice 27/52.0]
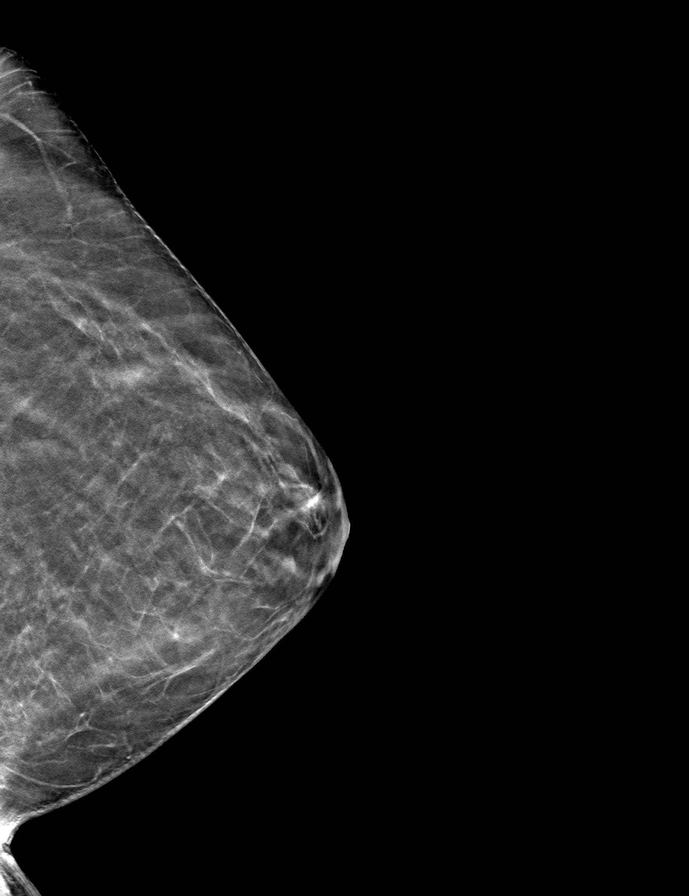

[8 of 24 positions shown; findings below may reference images not displayed]

ACR Breast Density Category b: There are scattered areas of
fibroglandular density.
FINDINGS: No suspicious calcifications, masses or areas of distortion are seen
in the bilateral breasts. There is a persistent mass seen in the
lateral left breast which appears mildly less prominent than on the
comparison exam. No suspicious calcifications, masses or areas of
distortion are seen in the bilateral breasts.

Ultrasound targeted to the left breast at 2 o'clock, 6 cm from the
nipple demonstrates a stable oval hypoechoic mass measuring 7 x 3 x
6 mm, previously measuring 7 x 3 x 7 mm.
IMPRESSION: 1.  The likely benign 7 mm left breast mass at 2 o'clock is stable.

2.  No mammographic evidence of malignancy in the bilateral breasts.

RECOMMENDATION:
Bilateral diagnostic mammogram and left breast ultrasound in 1 year.

I have discussed the findings and recommendations with the patient.
If applicable, a reminder letter will be sent to the patient
regarding the next appointment.

BI-RADS CATEGORY  3: Probably benign.

## 2022-05-23 ENCOUNTER — Other Ambulatory Visit: Payer: Self-pay | Admitting: Obstetrics and Gynecology

## 2022-05-23 DIAGNOSIS — Z09 Encounter for follow-up examination after completed treatment for conditions other than malignant neoplasm: Secondary | ICD-10-CM

## 2022-06-18 ENCOUNTER — Ambulatory Visit
Admission: RE | Admit: 2022-06-18 | Discharge: 2022-06-18 | Disposition: A | Discharge status: Home / Self Care | Payer: BC Managed Care – PPO | Source: Ambulatory Visit | Attending: Obstetrics and Gynecology | Admitting: Obstetrics and Gynecology

## 2022-06-18 DIAGNOSIS — Z09 Encounter for follow-up examination after completed treatment for conditions other than malignant neoplasm: Secondary | ICD-10-CM

## 2023-05-13 ENCOUNTER — Other Ambulatory Visit: Payer: Self-pay | Admitting: Obstetrics and Gynecology

## 2023-05-13 DIAGNOSIS — Z1231 Encounter for screening mammogram for malignant neoplasm of breast: Secondary | ICD-10-CM

## 2023-06-25 ENCOUNTER — Ambulatory Visit: Payer: BC Managed Care – PPO

## 2023-07-10 ENCOUNTER — Ambulatory Visit
Admission: RE | Admit: 2023-07-10 | Discharge: 2023-07-10 | Disposition: A | Discharge status: Home / Self Care | Payer: BC Managed Care – PPO | Source: Ambulatory Visit | Attending: Obstetrics and Gynecology | Admitting: Obstetrics and Gynecology

## 2023-07-10 DIAGNOSIS — Z1231 Encounter for screening mammogram for malignant neoplasm of breast: Secondary | ICD-10-CM

## 2024-06-24 ENCOUNTER — Other Ambulatory Visit: Payer: Self-pay | Admitting: Obstetrics and Gynecology

## 2024-06-24 DIAGNOSIS — Z1231 Encounter for screening mammogram for malignant neoplasm of breast: Secondary | ICD-10-CM

## 2024-07-13 ENCOUNTER — Ambulatory Visit
Admission: RE | Admit: 2024-07-13 | Discharge: 2024-07-13 | Disposition: A | Discharge status: Home / Self Care | Source: Ambulatory Visit | Attending: Obstetrics and Gynecology | Admitting: Obstetrics and Gynecology

## 2024-07-13 DIAGNOSIS — Z1231 Encounter for screening mammogram for malignant neoplasm of breast: Secondary | ICD-10-CM
